# Patient Record
Sex: Male | Born: 1990 | Race: Black or African American | Hispanic: No | Smoking: Never smoker
Health system: Southern US, Community
[De-identification: ages and names within clinical notes are randomized; demographics above are authoritative.]

---

## 2014-12-16 ENCOUNTER — Encounter: Payer: Self-pay | Admitting: *Deleted

## 2014-12-16 ENCOUNTER — Emergency Department
Admission: EM | Admit: 2014-12-16 | Discharge: 2014-12-16 | Disposition: A | Discharge status: Home / Self Care | Payer: No Typology Code available for payment source | Attending: Emergency Medicine | Admitting: Emergency Medicine

## 2014-12-16 ENCOUNTER — Emergency Department: Payer: No Typology Code available for payment source

## 2014-12-16 DIAGNOSIS — Y92410 Unspecified street and highway as the place of occurrence of the external cause: Secondary | ICD-10-CM | POA: Diagnosis not present

## 2014-12-16 DIAGNOSIS — Y9389 Activity, other specified: Secondary | ICD-10-CM | POA: Insufficient documentation

## 2014-12-16 DIAGNOSIS — S0181XA Laceration without foreign body of other part of head, initial encounter: Secondary | ICD-10-CM

## 2014-12-16 DIAGNOSIS — S01112A Laceration without foreign body of left eyelid and periocular area, initial encounter: Secondary | ICD-10-CM | POA: Insufficient documentation

## 2014-12-16 DIAGNOSIS — Y998 Other external cause status: Secondary | ICD-10-CM | POA: Diagnosis not present
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
## 2014-12-16 NOTE — Discharge Instructions (Signed)
Facial Laceration  A facial laceration is a cut on the face. These injuries can be painful and cause bleeding. Lacerations usually heal quickly, but they need special care to reduce scarring. DIAGNOSIS  Your health care provider will take a medical history, ask for details about how the injury occurred, and examine the wound to determine how deep the cut is. TREATMENT  Some facial lacerations may not require closure. Others may not be able to be closed because of an increased risk of infection. The risk of infection and the chance for successful closure will depend on various factors, including the amount of time since the injury occurred. The wound may be cleaned to help prevent infection. If closure is appropriate, pain medicines may be given if needed. Your health care provider will use stitches (sutures), wound glue (adhesive), or skin adhesive strips to repair the laceration. These tools bring the skin edges together to allow for faster healing and a better cosmetic outcome. If needed, you may also be given a tetanus shot. HOME CARE INSTRUCTIONS  Only take over-the-counter or prescription medicines as directed by your health care provider.  Follow your health care provider's instructions for wound care. These instructions will vary depending on the technique used for closing the wound. For Sutures:  Keep the wound clean and dry.   If you were given a bandage (dressing), you should change it at least once a day. Also change the dressing if it becomes wet or dirty, or as directed by your health care provider.   Wash the wound with soap and water 2 times a day. Rinse the wound off with water to remove all soap. Pat the wound dry with a clean towel.   After cleaning, apply a thin layer of the antibiotic ointment recommended by your health care provider. This will help prevent infection and keep the dressing from sticking.   You may shower as usual after the first 24 hours. Do not soak the  wound in water until the sutures are removed.   Get your sutures removed as directed by your health care provider. With facial lacerations, sutures should usually be taken out after 4-5 days to avoid stitch marks.   Wait a few days after your sutures are removed before applying any makeup. For Skin Adhesive Strips:  Keep the wound clean and dry.   Do not get the skin adhesive strips wet. You may bathe carefully, using caution to keep the wound dry.   If the wound gets wet, pat it dry with a clean towel.   Skin adhesive strips will fall off on their own. You may trim the strips as the wound heals. Do not remove skin adhesive strips that are still stuck to the wound. They will fall off in time.  For Wound Adhesive:  You may briefly wet your wound in the shower or bath. Do not soak or scrub the wound. Do not swim. Avoid periods of heavy sweating until the skin adhesive has fallen off on its own. After showering or bathing, gently pat the wound dry with a clean towel.   Do not apply liquid medicine, cream medicine, ointment medicine, or makeup to your wound while the skin adhesive is in place. This may loosen the film before your wound is healed.   If a dressing is placed over the wound, be careful not to apply tape directly over the skin adhesive. This may cause the adhesive to be pulled off before the wound is healed.   Avoid   prolonged exposure to sunlight or tanning lamps while the skin adhesive is in place.  The skin adhesive will usually remain in place for 5-10 days, then naturally fall off the skin. Do not pick at the adhesive film.  After Healing: Once the wound has healed, cover the wound with sunscreen during the day for 1 full year. This can help minimize scarring. Exposure to ultraviolet light in the first year will darken the scar. It can take 1-2 years for the scar to lose its redness and to heal completely.  SEEK IMMEDIATE MEDICAL CARE IF:  You have redness, pain, or  swelling around the wound.   You see ayellowish-white fluid (pus) coming from the wound.   You have chills or a fever.  MAKE SURE YOU:  Understand these instructions.  Will watch your condition.  Will get help right away if you are not doing well or get worse. Document Released: 05/15/2004 Document Revised: 01/26/2013 Document Reviewed: 11/18/2012 [employer] Patient Information 2015 [employer]. This information is not intended to replace advice given to you by your health care provider. Make sure you discuss any questions you have with your health care provider.  
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
